# Patient Record
Sex: Male | Born: 2000 | Race: White | Hispanic: No
Health system: Southern US, Community
[De-identification: ages and names within clinical notes are randomized; demographics above are authoritative.]

---
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
## 2019-04-09 ENCOUNTER — Ambulatory Visit: Payer: Self-pay

## 2020-04-26 ENCOUNTER — Encounter (HOSPITAL_COMMUNITY): Payer: Self-pay

## 2020-04-26 ENCOUNTER — Emergency Department (HOSPITAL_COMMUNITY): Payer: BC Managed Care – PPO

## 2020-04-26 ENCOUNTER — Emergency Department (HOSPITAL_COMMUNITY)
Admission: EM | Admit: 2020-04-26 | Discharge: 2020-04-26 | Disposition: A | Discharge status: Home / Self Care | Payer: BC Managed Care – PPO | Attending: Emergency Medicine | Admitting: Emergency Medicine

## 2020-04-26 ENCOUNTER — Other Ambulatory Visit: Payer: Self-pay

## 2020-04-26 DIAGNOSIS — R143 Flatulence: Secondary | ICD-10-CM | POA: Diagnosis not present

## 2020-04-26 DIAGNOSIS — F17210 Nicotine dependence, cigarettes, uncomplicated: Secondary | ICD-10-CM | POA: Diagnosis not present

## 2020-04-26 DIAGNOSIS — R1031 Right lower quadrant pain: Secondary | ICD-10-CM | POA: Insufficient documentation

## 2020-04-26 DIAGNOSIS — R1033 Periumbilical pain: Secondary | ICD-10-CM | POA: Insufficient documentation

## 2020-04-26 LAB — URINALYSIS, ROUTINE W REFLEX MICROSCOPIC
Bilirubin Urine: NEGATIVE
Glucose, UA: NEGATIVE mg/dL
Hgb urine dipstick: NEGATIVE
Ketones, ur: NEGATIVE mg/dL
Leukocytes,Ua: NEGATIVE
Nitrite: NEGATIVE
Protein, ur: NEGATIVE mg/dL
Specific Gravity, Urine: 1.017 (ref 1.005–1.030)
pH: 8 (ref 5.0–8.0)

## 2020-04-26 LAB — CBC WITH DIFFERENTIAL/PLATELET
Abs Immature Granulocytes: 0.04 10*3/uL (ref 0.00–0.07)
Basophils Absolute: 0 10*3/uL (ref 0.0–0.1)
Basophils Relative: 0 %
Eosinophils Absolute: 0.1 10*3/uL (ref 0.0–0.5)
Eosinophils Relative: 1 %
HCT: 47.2 % (ref 39.0–52.0)
Hemoglobin: 16 g/dL (ref 13.0–17.0)
Immature Granulocytes: 0 %
Lymphocytes Relative: 23 %
Lymphs Abs: 3 10*3/uL (ref 0.7–4.0)
MCH: 30.3 pg (ref 26.0–34.0)
MCHC: 33.9 g/dL (ref 30.0–36.0)
MCV: 89.4 fL (ref 80.0–100.0)
Monocytes Absolute: 0.8 10*3/uL (ref 0.1–1.0)
Monocytes Relative: 6 %
Neutro Abs: 9 10*3/uL — ABNORMAL HIGH (ref 1.7–7.7)
Neutrophils Relative %: 70 %
Platelets: 323 10*3/uL (ref 150–400)
RBC: 5.28 MIL/uL (ref 4.22–5.81)
RDW: 12.6 % (ref 11.5–15.5)
WBC: 12.8 10*3/uL — ABNORMAL HIGH (ref 4.0–10.5)
nRBC: 0 % (ref 0.0–0.2)

## 2020-04-26 LAB — COMPREHENSIVE METABOLIC PANEL
ALT: 19 U/L (ref 0–44)
AST: 21 U/L (ref 15–41)
Albumin: 4.8 g/dL (ref 3.5–5.0)
Alkaline Phosphatase: 82 U/L (ref 38–126)
Anion gap: 10 (ref 5–15)
BUN: 22 mg/dL — ABNORMAL HIGH (ref 6–20)
CO2: 24 mmol/L (ref 22–32)
Calcium: 9.7 mg/dL (ref 8.9–10.3)
Chloride: 105 mmol/L (ref 98–111)
Creatinine, Ser: 0.93 mg/dL (ref 0.61–1.24)
GFR, Estimated: >60 mL/min (ref >60)
Glucose, Bld: 108 mg/dL — ABNORMAL HIGH (ref 70–99)
Potassium: 4.1 mmol/L (ref 3.5–5.1)
Sodium: 139 mmol/L (ref 135–145)
Total Bilirubin: 0.8 mg/dL (ref 0.3–1.2)
Total Protein: 7.8 g/dL (ref 6.5–8.1)

## 2020-04-26 LAB — LIPASE, BLOOD: Lipase: 29 U/L (ref 11–51)

## 2020-04-26 MED ORDER — ONDANSETRON HCL 4 MG/2ML IJ SOLN
4.0000 mg | Freq: Once | INTRAMUSCULAR | Status: AC
  Administered 2020-04-26: 4 mg via INTRAVENOUS
  Filled 2020-04-26: qty 2

## 2020-04-26 MED ORDER — IOHEXOL 300 MG/ML  SOLN
100.0000 mL | Freq: Once | INTRAMUSCULAR | Status: AC | PRN
  Administered 2020-04-26: 100 mL via INTRAVENOUS

## 2020-04-26 MED ORDER — MORPHINE SULFATE (PF) 2 MG/ML IV SOLN
2.0000 mg | Freq: Once | INTRAVENOUS | Status: AC
Start: 2020-04-26 — End: 2020-04-26
  Administered 2020-04-26: 2 mg via INTRAVENOUS
  Filled 2020-04-26: qty 1

## 2020-04-26 MED ORDER — SODIUM CHLORIDE 0.9 % IV BOLUS
1000.0000 mL | Freq: Once | INTRAVENOUS | Status: AC
  Administered 2020-04-26: 1000 mL via INTRAVENOUS

## 2020-04-26 NOTE — ED Triage Notes (Signed)
Pt arrived via walk in, sudden onset right lower quad abd pain last night. Was seen by PCP, sent for rule of appendicitis. States diarrhea one and off over the last week, also endorses fever.

## 2020-04-26 NOTE — ED Notes (Signed)
Pt currently still in CT, mom at bedside

## 2020-04-26 NOTE — Discharge Instructions (Addendum)
As we discussed today your CT scan did not clearly show your appendix.  Your CT scan did not show evidence of inflammation or irritation in the area of your appendix which we would typically expect to see if you had appendicitis. We discussed that your white blood cell count was slightly high however that can be caused by many things including your diarrhea. We discussed option of drinking oral contrast and repeating CAT scan versus discharge home with return precautions.  Through shared decision-making after we discussed risk, benefits, and alternatives decision made to try sending you home. If you develop fevers, worsening pain, are unable to tolerate eating or drinking, or have any new or concerning symptoms please seek additional medical care and evaluation. We discussed that there is a possibility that you may have either early or developing appendicitis which is why you should not hesitate to return if you have change in your symptoms. Additionally we discussed risks of radiation versus risks of delaying appendicitis diagnosis including possibility of worsening condition and perforation.  On your CT scan it does look like you have a increased amount of feces in the area of your pain.  You may try taking 1 dose of MiraLAX every day for the next 3 to 4 days.  It is important that you drink extra water with this.    Some of the lymph nodes in your abdomen or slightly enlarged, this is most likely due to your diarrhea however I would recommend primary care follow-up.  Today you received medications that may make you sleepy or impair your ability to make decisions.  For the next 24 hours please do not drive, operate heavy machinery, care for a small child with out another adult present, or perform any activities that may cause harm to you or someone else if you were to fall asleep or be impaired.

## 2020-04-26 NOTE — ED Provider Notes (Signed)
[facility]-EMERGENCY DEPT Provider Note   CSN: [id number] Arrival date & time: 04/26/20  1135     History Chief Complaint  Patient presents with  . Abdominal Pain    Gerald Austin is a 20 y.o. male with no relevant past medical history presents to the ED sent from primary care provider with complaints of right lower quadrant abdominal pain.  On my examination, patient reports that he developed periumbilical/suprapubic abdominal pain last evening that has since radiated to his right lower quadrant.  He went to his primary care provider this morning and had a low-grade temperature of 100 F.  Patient states that he last ate at approximately 9:30 AM.  Patient also notes that he recently started working for [employer] in the past few days which is a physically demanding job requiring lifting.  He also has been doing work around the house.  He has not taken anything for symptoms.  Patient was concerned this might just be constipation because his stools have been rather hard and dry, but still passing gas and most recent BM this morning.    He denies any other fevers, nausea or emesis, inability eat or drink, testicular pain or swelling, penile discharge, headache, chest pain, or other symptoms.    He is accompanied by his grandmother who is at bedside.  HPI     History reviewed. No pertinent past medical history.  There are no problems to display for this patient.   Past Surgical History:  Procedure Laterality Date  . WISDOM TOOTH EXTRACTION         History reviewed. No pertinent family history.  Social History   Tobacco Use  . Smoking status: Current Every Day Smoker    Packs/day: 1.00    Types: Cigarettes  . Smokeless tobacco: Never Used    Home Medications Prior to Admission medications   Medication Sig Start Date End Date Taking? Authorizing Provider  acetaminophen (TYLENOL) 325 MG tablet Take 650 mg by mouth every 6 (six) hours as needed for mild  pain, fever or headache.   Yes [provider]  calcium carbonate (TUMS - DOSED IN MG ELEMENTAL CALCIUM) 500 MG chewable tablet Chew 1 tablet by mouth 3 (three) times daily as needed for indigestion or heartburn.   Yes [provider]    Allergies    Patient has no known allergies.  Review of Systems   Review of Systems  All other systems reviewed and are negative.   Physical Exam Updated Vital Signs BP 126/75 (BP Location: Right Arm)   Pulse 79   Temp (!) 97.4 F (36.3 C) (Oral)   Resp 12   SpO2 98%   Physical Exam Vitals and nursing note reviewed. Exam conducted with a chaperone present.  Constitutional:      General: He is not in acute distress.    Appearance: He is ill-appearing. He is not toxic-appearing.  HENT:     Head: Normocephalic and atraumatic.  Eyes:     General: No scleral icterus.    Conjunctiva/sclera: Conjunctivae normal.  Cardiovascular:     Rate and Rhythm: Normal rate and regular rhythm.  Pulmonary:     Effort: Pulmonary effort is normal. No respiratory distress.  Abdominal:     General: Abdomen is flat. There is no distension.     Palpations: Abdomen is soft.     Tenderness: There is abdominal tenderness. There is no guarding.     Comments: Soft, nondistended.  Significant TTP in RLQ.  Positive McBurney's point tenderness.  Negative Rovsing sign.  No significant tenderness elsewhere.  No guarding.  Positive jump sign.  Musculoskeletal:     Cervical back: Normal range of motion. No rigidity.  Skin:    General: Skin is dry.  Neurological:     Mental Status: He is alert.     GCS: GCS eye subscore is 4. GCS verbal subscore is 5. GCS motor subscore is 6.  Psychiatric:        Mood and Affect: Mood normal.        Behavior: Behavior normal.        Thought Content: Thought content normal.     ED Results / Procedures / Treatments   Labs (all labs ordered are listed, but only abnormal results are displayed) Labs Reviewed  CBC WITH  DIFFERENTIAL/PLATELET - Abnormal; Notable for the following components:      Result Value   WBC 12.8 (*)    Neutro Abs 9.0 (*)    All other components within normal limits  COMPREHENSIVE METABOLIC PANEL - Abnormal; Notable for the following components:   Glucose, Bld 108 (*)    BUN 22 (*)    All other components within normal limits  URINALYSIS, ROUTINE W REFLEX MICROSCOPIC  LIPASE, BLOOD    EKG None  Radiology No results found.  Procedures Procedures   Medications Ordered in ED Medications  ondansetron (ZOFRAN) injection 4 mg (4 mg Intravenous Given 04/26/20 1422)  morphine 2 MG/ML injection 2 mg (2 mg Intravenous Given 04/26/20 1424)  sodium chloride 0.9 % bolus 1,000 mL (0 mLs Intravenous Stopped 04/26/20 1532)  iohexol (OMNIPAQUE) 300 MG/ML solution 100 mL (100 mLs Intravenous Contrast Given 04/26/20 1540)    ED Course  I have reviewed the triage vital signs and the nursing notes.  Pertinent labs & imaging results that were available during my care of the patient were reviewed by me and considered in my medical decision making (see chart for details).    MDM Rules/Calculators/A&P                          Gerald Austin was evaluated in Emergency Department on 04/30/2020 for the symptoms described in the history of present illness. He was evaluated in the context of the global COVID-19 pandemic, which necessitated consideration that the patient might be at risk for infection with the SARS-CoV-2 virus that causes COVID-19. Institutional protocols and algorithms that pertain to the evaluation of patients at risk for COVID-19 are in a state of rapid change based on information released by regulatory bodies including the CDC and federal and state organizations. These policies and algorithms were followed during the patient's care in the ED.  I personally reviewed patient's medical chart and all notes from triage and staff during today's encounter. I have also ordered and reviewed all  labs and imaging that I felt to be medically necessary in the evaluation of this patient's complaints and with consideration of their physical exam. If needed, translation services were available and utilized.   Patient's history physical exam concerning for acute appendicitis.  If work-up is negative, suspect musculoskeletal strain given his new job with increased physical activity and lifting demands.  Denies any testicular pain or swelling.  No nausea, emesis, or diarrhea suggestive of enteritis.  We will start patient on IV fluids, IV Zofran, and morphine for pain while laboratory work-up is pending.  CT abdomen pelvis with contrast ordered.  At shift change care  was transferred to Lyndel Safe, PA-C who will follow pending studies, re-evaluate, and determine disposition.     Final Clinical Impression(s) / ED Diagnoses Final diagnoses:  Right lower quadrant abdominal pain    Rx / DC Orders ED Discharge Orders    None       Lorelee New, PA-C 04/30/20 1538    Lorre Nick, MD 05/03/20 1019

## 2020-04-26 NOTE — ED Provider Notes (Signed)
I assumed care of patient at shift change from previous team, please see their note for full h and P.  Briefly patient is here for RLQ abdominal pain that started last night.  He was seen by PCP and sent for appendicitis r/o.   Plan is to follow up on CT scan.  If negative expect MSK pain and discharge home.    CT scan showed appendix was not well visualized however no secondary inflammatory changes in the right lower quadrant to suspect appendicitis.  He does have a possibly reactive borderline lymph node. I discussed option with patient and family at bedside of repeat CT scan with p.o. contrast versus discharge home with returning in 24 to 48 hours for repeat abdominal exam or sooner if symptoms worsen. We discussed risks, benefits, and alternatives of both of these options.  I am able to press on patient's right lower quadrant firmly without significant pain at this time making appendicitis less likely. On my view of the CT scan it does appear like he has increased stool in the area of his pain and may have pain from gas versus stool. After extensive discussion with patient and family involving labs, imaging decision is made for discharge home.  I am reassured by the fact that I can press on his abdomen without significant pain and he does not appear to have acute abdomen at the time of my evaluation.  He has no rebound, referred pain, or guarding.  Patient and family are aware that there is still a small possibility given that they could not clearly identify his appendix that he may still have either early or developing appendicitis and state their understanding of this.  Return precautions were discussed with patient who states their understanding.  At the time of discharge patient denied any unaddressed complaints or concerns.  Patient is agreeable for discharge home.  Note: Portions of this report may have been transcribed using voice recognition software. Every effort was made to ensure  accuracy; however, inadvertent computerized transcription errors may be present   CT ABDOMEN PELVIS W CONTRAST  Result Date: 04/26/2020 CLINICAL DATA:  Right lower quadrant pain, sudden onset. EXAM: CT ABDOMEN AND PELVIS WITH CONTRAST TECHNIQUE: Multidetector CT imaging of the abdomen and pelvis was performed using the standard protocol following bolus administration of intravenous contrast. CONTRAST:  OMNIPAQUE IOHEXOL 300 MG/ML  SOLN COMPARISON:  None. FINDINGS: Lower chest: Lung bases are clear. Heart size normal. No pericardial or pleural effusion. Distal esophagus is unremarkable. Hepatobiliary: Liver and gallbladder are unremarkable. No biliary ductal dilatation. Pancreas: Negative. Spleen: Negative. Adrenals/Urinary Tract: Adrenal glands and kidneys are unremarkable. Ureters are decompressed. Bladder is low in volume. Stomach/Bowel: Stomach, small bowel and colon are unremarkable. Appendix is not well visualized. Vascular/Lymphatic: Vascular structures are unremarkable. Ileocolic mesenteric lymph nodes measure up to 10 mm (coronal image 39). Reproductive: Prostate is visualized. Other: No free fluid.  Mesenteries and peritoneum are unremarkable. Musculoskeletal: None. IMPRESSION: 1. Appendix is not well-visualized. No definitive inflammatory changes in the right lower quadrant to suggest appendicitis. If there is high clinical suspicion, patient could drink oral contrast followed by repeat imaging. 2. Borderline enlarged ileocolic mesenteric lymph node, likely reactive. Electronically Signed   By: Leanna Battles M.D.   On: 04/26/2020 16:25    Labs Reviewed  CBC WITH DIFFERENTIAL/PLATELET - Abnormal; Notable for the following components:      Result Value   WBC 12.8 (*)    Neutro Abs 9.0 (*)    All  other components within normal limits  COMPREHENSIVE METABOLIC PANEL - Abnormal; Notable for the following components:   Glucose, Bld 108 (*)    BUN 22 (*)    All other components within  normal limits  URINALYSIS, ROUTINE W REFLEX MICROSCOPIC  LIPASE, BLOOD         Cristina Gong, PA-C 04/27/20 0623    Alvira Monday, MD 04/28/20 1705

## 2021-09-26 IMAGING — CT CT ABD-PELV W/ CM
2 of 4 series · 16 of 46 positions shown, 18 images · IV contrast (omnipaque)
Comparison: None.

CLINICAL DATA: Right lower quadrant pain, sudden onset.

EXAM:
CT ABDOMEN AND PELVIS WITH CONTRAST
TECHNIQUE: Multidetector CT imaging of the abdomen and pelvis was performed
using the standard protocol following bolus administration of
intravenous contrast.
CONTRAST:  100mL OMNIPAQUE IOHEXOL 300 MG/ML  SOLN

[Series 2: axial st · axial · 0.68mm/px · z∈[-765,-375]mm · 13 of 90 slices shown, 15 images]
[im 6/90  soft-tissue]
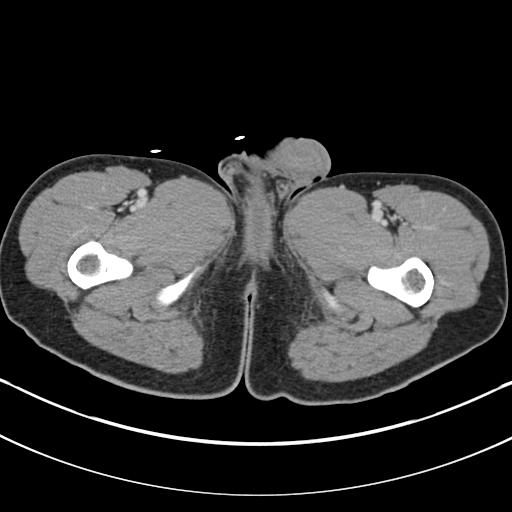
[im 6/90  bone]
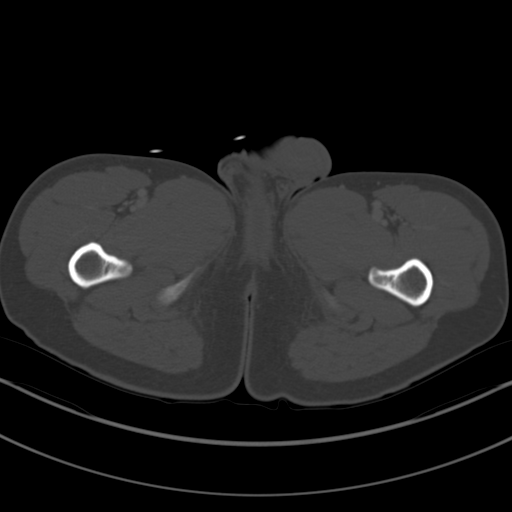
[im 12/90  soft-tissue]
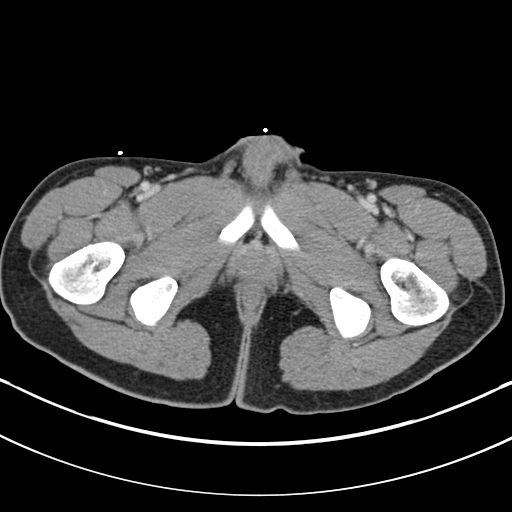
[im 17/90  soft-tissue]
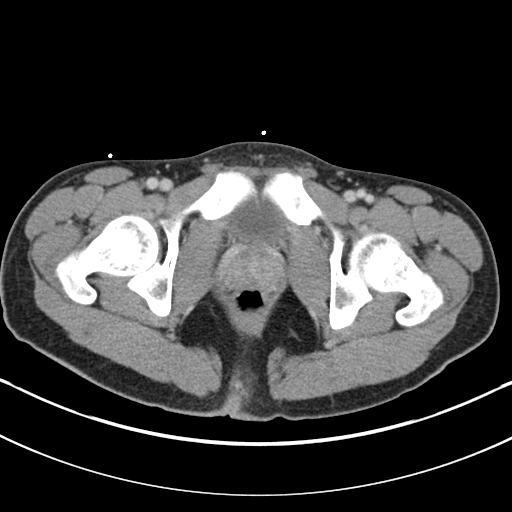
[im 28/90  soft-tissue]
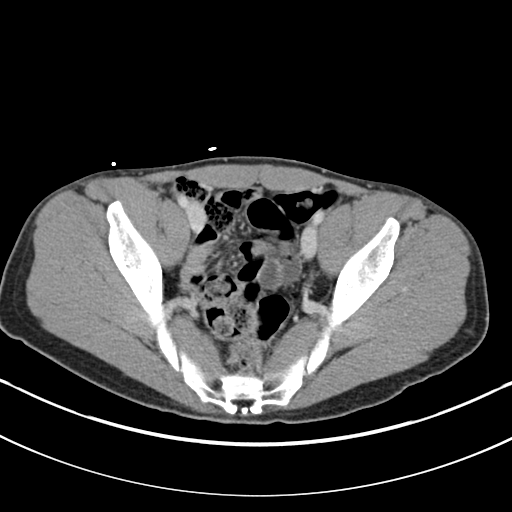
[im 34/90  soft-tissue]
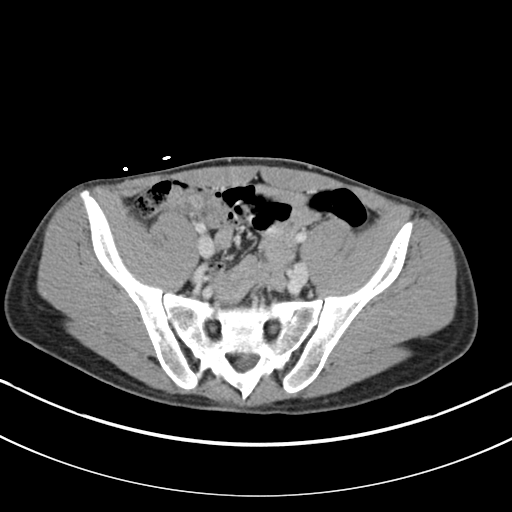
[im 39/90  soft-tissue]
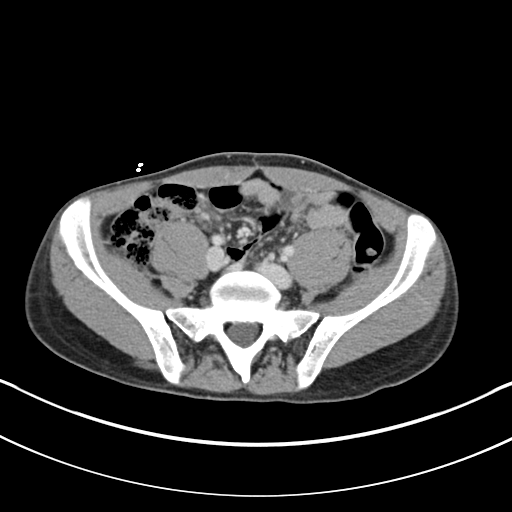
[im 45/90  soft-tissue]
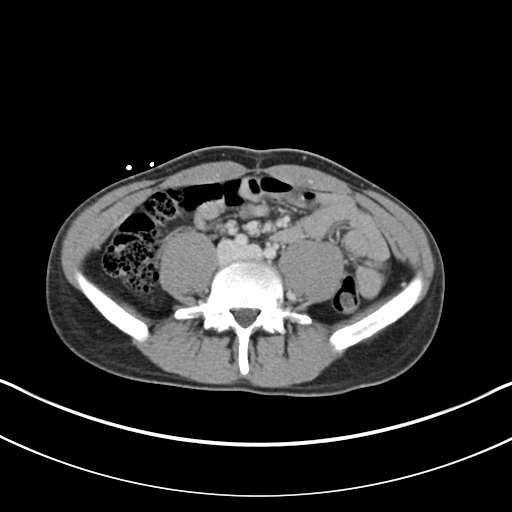
[im 51/90  soft-tissue]
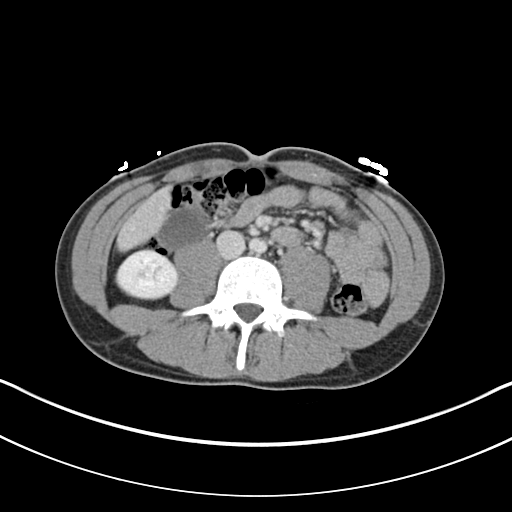
[im 56/90  soft-tissue]
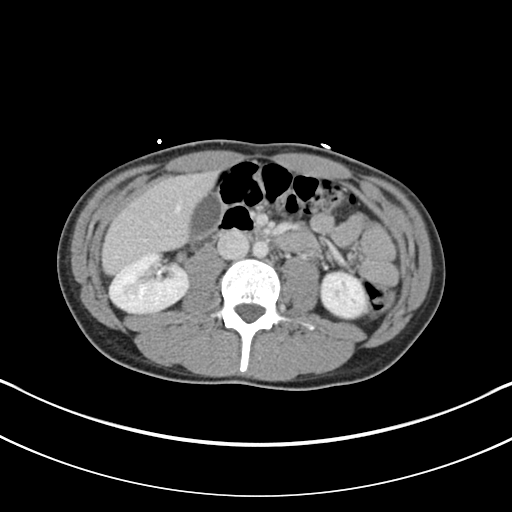
[im 56/90  bone]
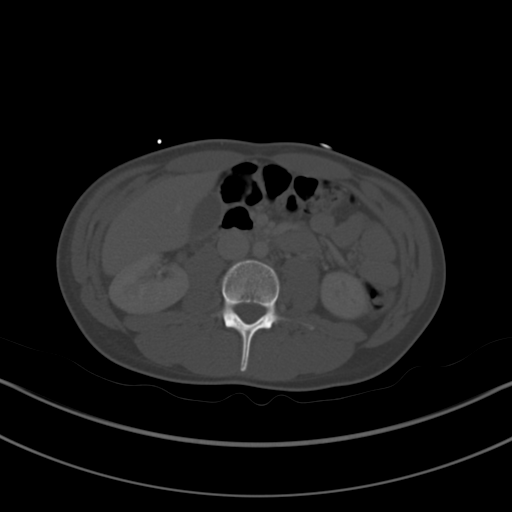
[im 62/90  soft-tissue]
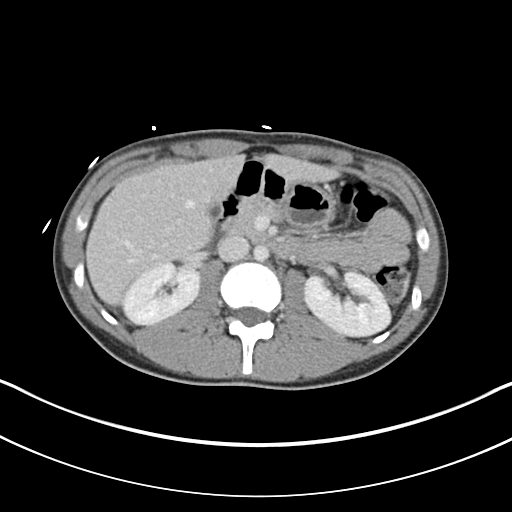
[im 73/90  soft-tissue]
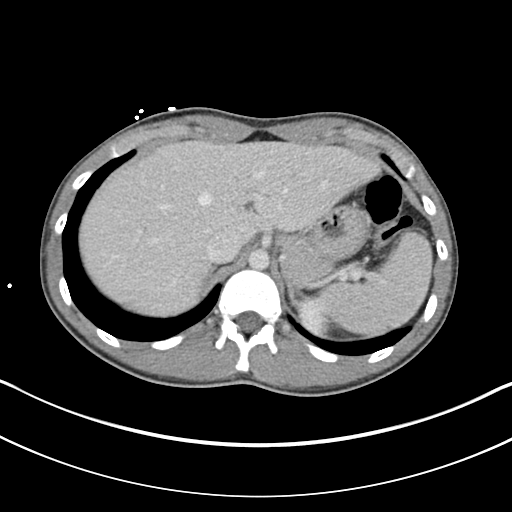
[im 78/90  soft-tissue]
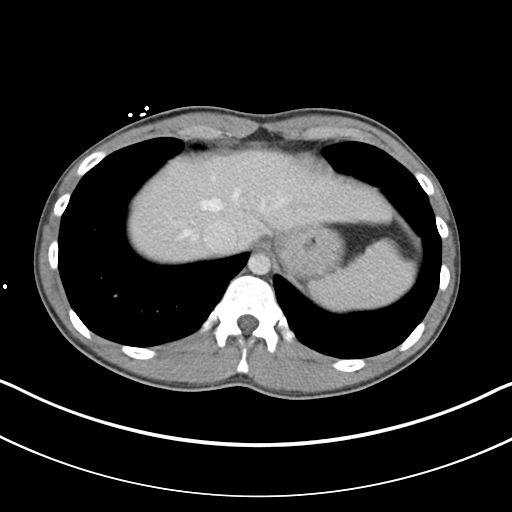
[im 84/90  soft-tissue]
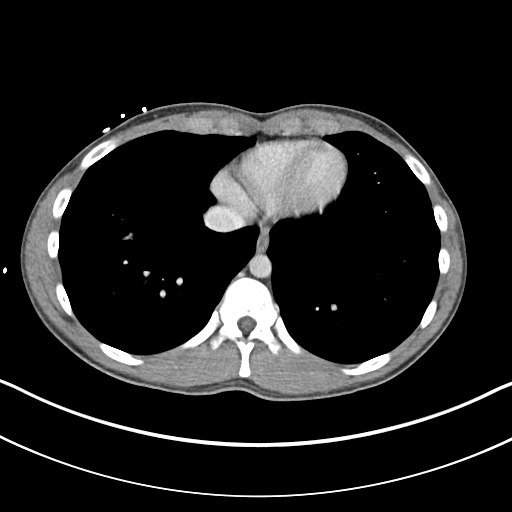

[Series 4: coronal st · coronal · 0.72mm/px · 3 of 103 slices shown]
[im 35/103  soft-tissue]
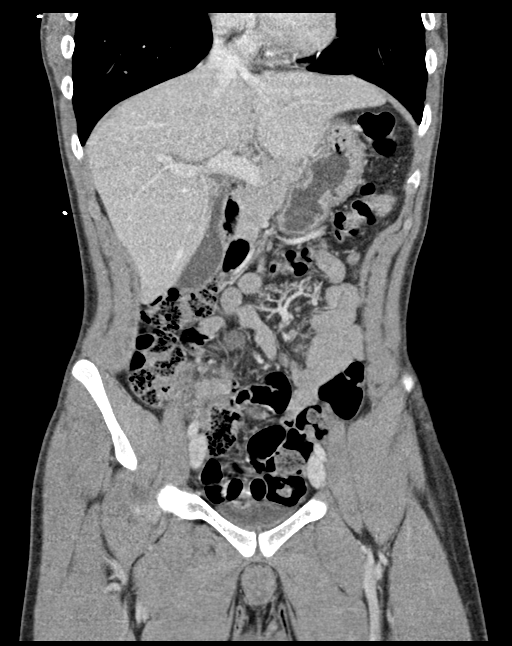
[im 46/103  soft-tissue]
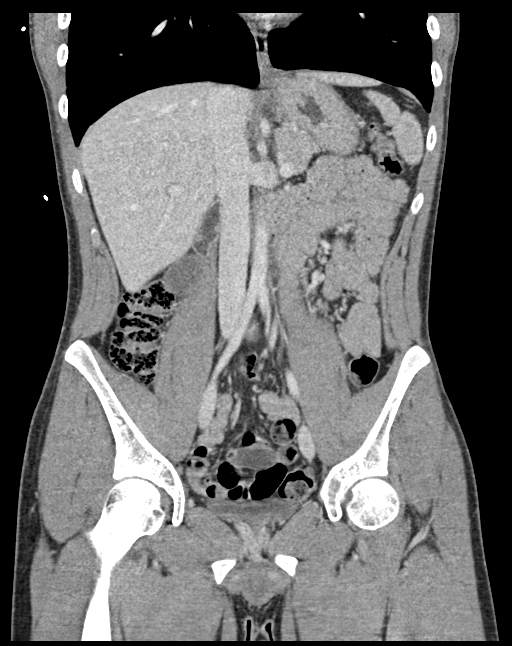
[im 57/103  soft-tissue]
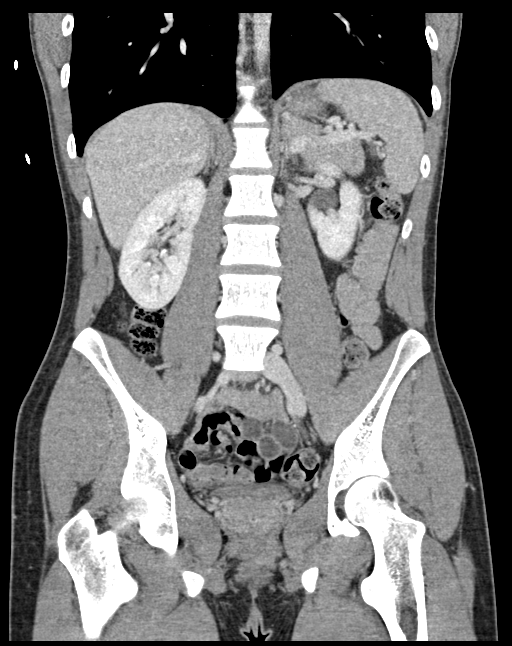

[16 of 46 positions shown; findings below may reference images not displayed]

FINDINGS: Lower chest: Lung bases are clear. Heart size normal. No pericardial
or pleural effusion. Distal esophagus is unremarkable.

Hepatobiliary: Liver and gallbladder are unremarkable. No biliary
ductal dilatation.

Pancreas: Negative.

Spleen: Negative.

Adrenals/Urinary Tract: Adrenal glands and kidneys are unremarkable.
Ureters are decompressed. Bladder is low in volume.

Stomach/Bowel: Stomach, small bowel and colon are unremarkable.
Appendix is not well visualized.

Vascular/Lymphatic: Vascular structures are unremarkable. Ileocolic
mesenteric lymph nodes measure up to 10 mm (coronal image 39).

Reproductive: Prostate is visualized.

Other: No free fluid.  Mesenteries and peritoneum are unremarkable.

Musculoskeletal: None.
IMPRESSION: 1. Appendix is not well-visualized. No definitive inflammatory
changes in the right lower quadrant to suggest appendicitis. If
there is high clinical suspicion, patient could drink oral contrast
followed by repeat imaging.
2. Borderline enlarged ileocolic mesenteric lymph node, likely
reactive.
